# Patient Record
Sex: Female | Born: 2005 | Race: White | Hispanic: Yes | Marital: Single | Smoking: Never smoker
Health system: Southern US, Community
[De-identification: ages and names within clinical notes are randomized; demographics above are authoritative.]

## PROBLEM LIST (undated history)

## (undated) DIAGNOSIS — R1115 Cyclical vomiting syndrome unrelated to migraine: Secondary | ICD-10-CM

## (undated) DIAGNOSIS — F419 Anxiety disorder, unspecified: Secondary | ICD-10-CM

---
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
## 2023-01-26 IMAGING — DX DG CHEST 1V PORT
1 series · 1 of 1 positions shown · non-contrast
Comparison: None.

CLINICAL DATA: Cough

EXAM:
PORTABLE CHEST 1 VIEW

[chest]
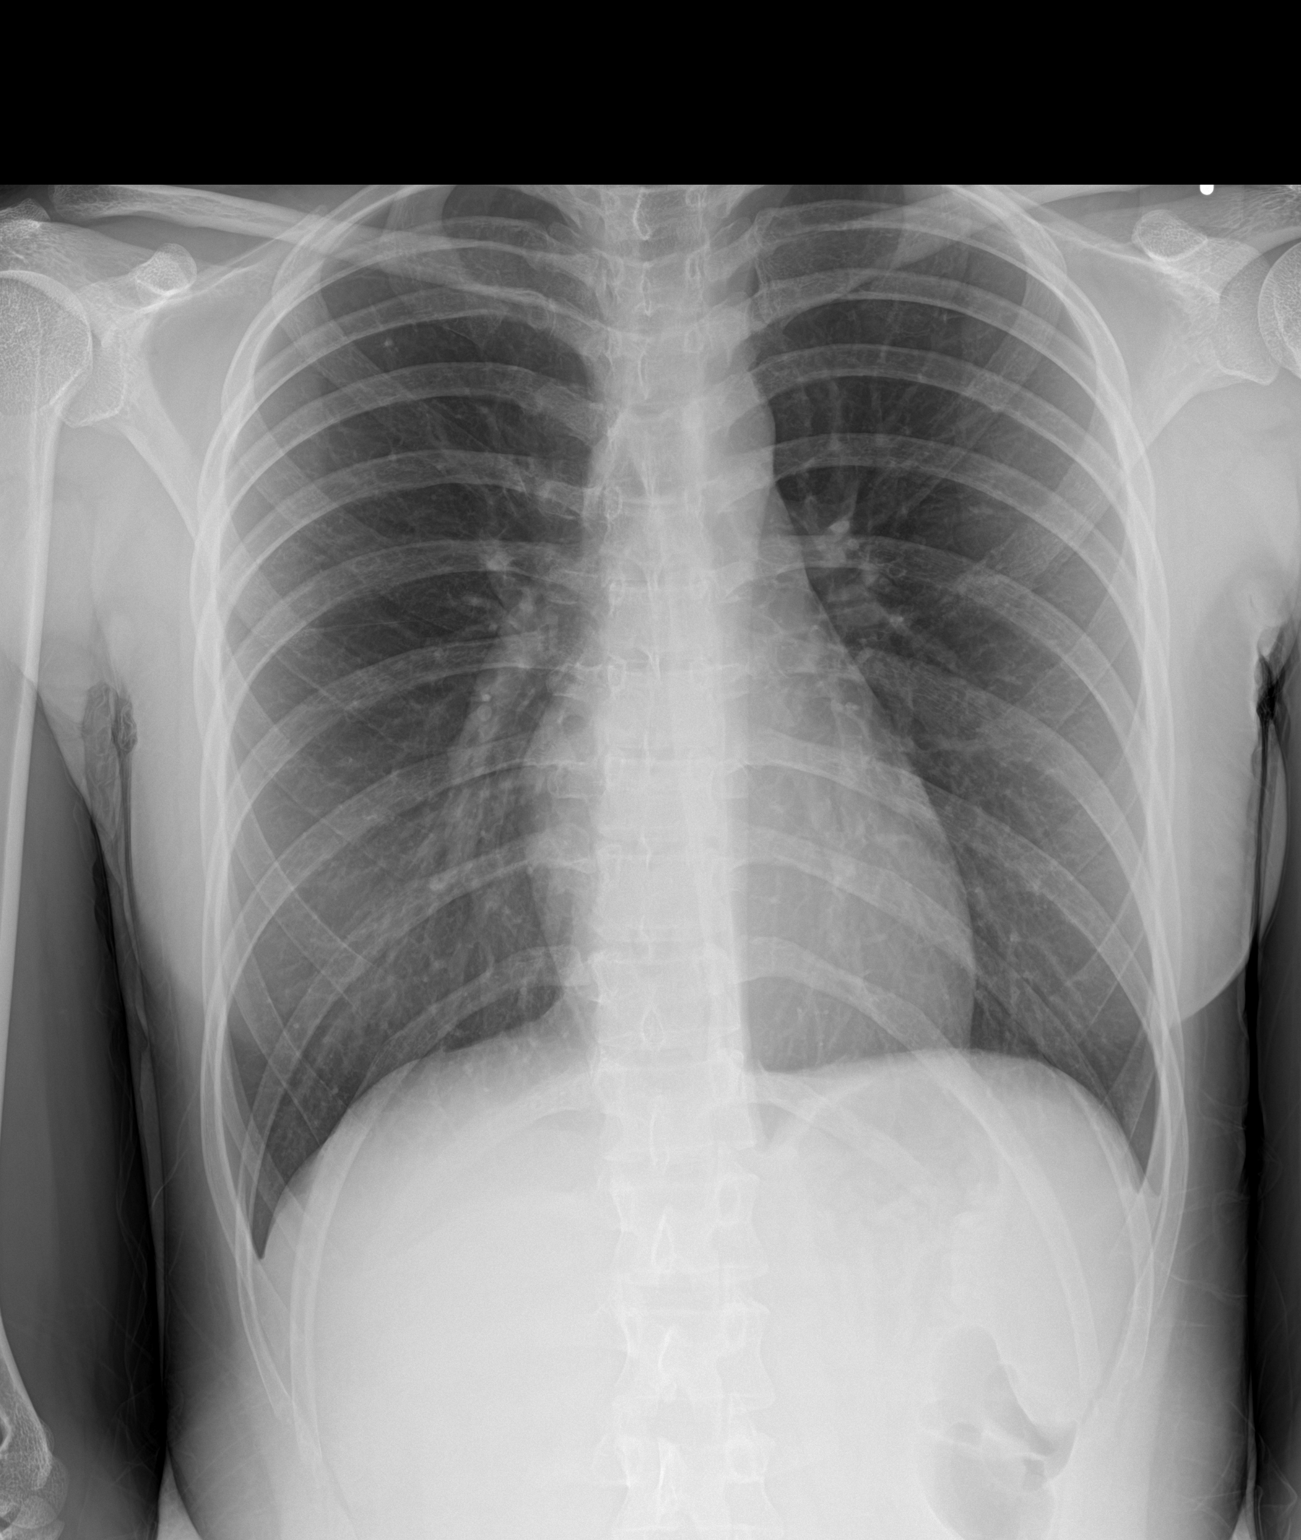

[1 of 1 positions shown; findings below may reference images not displayed]

FINDINGS: The cardiomediastinal silhouette is within normal limits. There is
no focal airspace consolidation. There is no pleural effusion or
visible pneumothorax. There is no acute osseous abnormality.
IMPRESSION: No evidence of acute cardiopulmonary disease.

## 2023-08-12 ENCOUNTER — Ambulatory Visit: Payer: Medicaid Other
